# Patient Record
Sex: Male | Born: 1995 | Race: Black or African American | Hispanic: No | Marital: Single | Smoking: Current every day smoker
Health system: Southern US, Community
[De-identification: ages and names within clinical notes are randomized; demographics above are authoritative.]

---
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
## 2014-05-14 ENCOUNTER — Emergency Department (HOSPITAL_COMMUNITY)
Admission: EM | Admit: 2014-05-14 | Discharge: 2014-05-14 | Disposition: A | Discharge status: Home / Self Care | Payer: Managed Care, Other (non HMO) | Source: Home / Self Care | Attending: Emergency Medicine | Admitting: Emergency Medicine

## 2014-05-14 ENCOUNTER — Encounter (HOSPITAL_COMMUNITY): Payer: Self-pay | Admitting: Emergency Medicine

## 2014-05-14 ENCOUNTER — Emergency Department (INDEPENDENT_AMBULATORY_CARE_PROVIDER_SITE_OTHER): Payer: Managed Care, Other (non HMO)

## 2014-05-14 DIAGNOSIS — J069 Acute upper respiratory infection, unspecified: Secondary | ICD-10-CM

## 2014-05-14 MED ORDER — GUAIFENESIN-CODEINE 100-10 MG/5ML PO SYRP: 5.0000 mL | ORAL_SOLUTION | Freq: Every evening | ORAL | Status: DC | PRN

## 2014-05-14 MED ORDER — ALBUTEROL SULFATE HFA 108 (90 BASE) MCG/ACT IN AERS: 2.0000 | INHALATION_SPRAY | Freq: Four times a day (QID) | RESPIRATORY_TRACT | Status: DC | PRN

## 2014-05-14 NOTE — ED Notes (Signed)
eval by MD only

## 2014-05-14 NOTE — ED Provider Notes (Signed)
CSN: [id number]     Arrival date & time 05/14/14  1819 History   First MD Initiated Contact with Patient 05/14/14 1855     No chief complaint on file.  (Consider location/radiation/quality/duration/timing/severity/associated sxs/prior Treatment) HPI Patient is a 18 yo M presenting to Urgent Care for 1 day history of weakness, nausea, cough and mucus. No fevers at home but does report chills. Tried Mucinex at home which did not help. No known sick contacts. Works with [employer]. No vomiting, some loose stools. Smokes non-tobacco cigarettes daily. Main concern is that he feels shortness of breath when lying back flat associated with cough.   No past medical history on file. No past surgical history on file. Family History  Problem Relation Age of Onset  . Family history unknown: Yes   History  Substance Use Topics  . Smoking status: Not on file  . Smokeless tobacco: Not on file  . Alcohol Use:     Review of Systems  Constitutional: Positive for chills, appetite change and fatigue. Negative for fever.  HENT: Positive for congestion, sinus pressure and sore throat.   Eyes: Negative for visual disturbance.  Respiratory: Positive for cough and shortness of breath. Negative for wheezing.   Cardiovascular: Negative for chest pain and leg swelling.  Gastrointestinal: Positive for nausea and diarrhea. Negative for abdominal pain.  Genitourinary: Negative for dysuria.  Musculoskeletal: Negative for arthralgias and myalgias.  Skin: Negative for rash.  Neurological: Negative for headaches.    Allergies  Review of patient's allergies indicates no known allergies.  Home Medications   Prior to Admission medications   Medication Sig Start Date End Date Taking? Authorizing Provider  acetaminophen-codeine (TYLENOL #3) 300-30 MG per tablet Take 1 tablet by mouth every 4 (four) hours as needed for pain. 11/11/12   Adlih Moreno-Coll, MD  loperamide (IMODIUM) 2 MG capsule Take 1 capsule (2 mg  total) by mouth 4 (four) times daily as needed for diarrhea or loose stools. 11/11/12   Adlih Moreno-Coll, MD  ondansetron (ZOFRAN-ODT) 4 MG disintegrating tablet Take 1 tablet (4 mg total) by mouth every 8 (eight) hours as needed for nausea. 11/11/12   Adlih Moreno-Coll, MD   BP 140/79  Pulse 86  Temp(Src) 98.6 F (37 C) (Oral)  Resp 14  SpO2 97% Physical Exam  Constitutional: He is oriented to person, place, and time. He appears well-developed and well-nourished. No distress.  HENT:  Head: Normocephalic and atraumatic.  Posterior pharynx erythema without exudate  Eyes: Conjunctivae are normal. Pupils are equal, round, and reactive to light.  Neck: Normal range of motion. Neck supple.  Cardiovascular: Normal rate, regular rhythm and normal heart sounds.   No murmur heard. Pulmonary/Chest: Effort normal. No respiratory distress. He has wheezes (Left sided).  Abdominal: Soft. He exhibits no distension. There is no tenderness.  Musculoskeletal: Normal range of motion. He exhibits no edema and no tenderness.  Neurological: He is alert and oriented to person, place, and time.  Skin: Skin is warm and dry.  Psychiatric: He has a normal mood and affect.    ED Course  Procedures (including critical care time) Labs Review Labs Reviewed - No data to display  Imaging Review No results found.   MDM  No diagnosis found. 7:25pm- Patient evaluated. Noted to have focal wheezes on left side. Will obtain X-ray. Discussed with patient and he agrees to proceed.  7:55pm- CXR does not reveal pneumonia or other active pulmonary disease. Wheezes most likely secondary to smoking and/or viral infection.  Will proceed with treatment for viral URI. Robitussin AC prn cough, advised it will make him sleepy. Albuterol prn wheezing or cough. Discussed no indication for antibiotic. F/u if he does not improve.   Hilarie FredricksonAmber M Lateefah Mallery, MD 05/14/14 2004

## 2014-05-14 NOTE — ED Provider Notes (Signed)
Medical screening examination/treatment/procedure(s) were performed by a resident physician and as supervising physician I was immediately available for consultation/collaboration.  Leslee Home, M.D.  Reuben Likes, MD 05/14/14 2224

## 2014-05-14 NOTE — Discharge Instructions (Signed)
Cough, Adult ° A cough is a reflex. It helps you clear your throat and airways. A cough can help heal your body. A cough can last 2 or 3 weeks (acute) or may last more than 8 weeks (chronic). Some common causes of a cough can include an infection, allergy, or a cold. °HOME CARE °· Only take medicine as told by your doctor. °· If given, take your medicines (antibiotics) as told. Finish them even if you start to feel better. °· Use a cold steam vaporizer or humidier in your home. This can help loosen thick spit (secretions). °· Sleep so you are almost sitting up (semi-upright). Use pillows to do this. This helps reduce coughing. °· Rest as needed. °· Stop smoking if you smoke. °GET HELP RIGHT AWAY IF: °· You have yellowish-white fluid (pus) in your thick spit. °· Your cough gets worse. °· Your medicine does not reduce coughing, and you are losing sleep. °· You cough up blood. °· You have trouble breathing. °· Your pain gets worse and medicine does not help. °· You have a fever. °MAKE SURE YOU:  °· Understand these instructions. °· Will watch your condition. °· Will get help right away if you are not doing well or get worse. °Document Released: 08/05/2011 Document Revised: 02/14/2012 Document Reviewed: 08/05/2011 °Exit[employer]® Patient Information ©2014 [employer]. °Upper Respiratory Infection, Adult °An upper respiratory infection (URI) is also known as the common cold. It is often caused by a type of germ (virus). Colds are easily spread (contagious). You can pass it to others by kissing, coughing, sneezing, or drinking out of the same glass. Usually, you get better in 1 or 2 weeks.  °HOME CARE  °· Only take medicine as told by your doctor. °· Use a warm mist humidifier or breathe in steam from a hot shower. °· Drink enough water and fluids to keep your pee (urine) clear or pale yellow. °· Get plenty of rest. °· Return to work when your temperature is back to normal or as told by your doctor. You may use a face mask  and wash your hands to stop your cold from spreading. °GET HELP RIGHT AWAY IF:  °· After the first few days, you feel you are getting worse. °· You have questions about your medicine. °· You have chills, shortness of breath, or brown or red spit (mucus). °· You have yellow or brown snot (nasal discharge) or pain in the face, especially when you bend forward. °· You have a fever, puffy (swollen) neck, pain when you swallow, or white spots in the back of your throat. °· You have a bad headache, ear pain, sinus pain, or chest pain. °· You have a high-pitched whistling sound when you breathe in and out (wheezing). °· You have a lasting cough or cough up blood. °· You have sore muscles or a stiff neck. °MAKE SURE YOU:  °· Understand these instructions. °· Will watch your condition. °· Will get help right away if you are not doing well or get worse. °Document Released: 05/10/2008 Document Revised: 02/14/2012 Document Reviewed: 03/29/2011 °Exit[employer]® Patient Information ©2014 [employer]. ° °

## 2014-09-07 ENCOUNTER — Emergency Department (HOSPITAL_COMMUNITY)
Admission: EM | Admit: 2014-09-07 | Discharge: 2014-09-07 | Disposition: A | Discharge status: Home / Self Care | Payer: No Typology Code available for payment source | Attending: Emergency Medicine | Admitting: Emergency Medicine

## 2014-09-07 ENCOUNTER — Emergency Department (HOSPITAL_COMMUNITY): Payer: No Typology Code available for payment source

## 2014-09-07 ENCOUNTER — Encounter (HOSPITAL_COMMUNITY): Payer: Self-pay | Admitting: Emergency Medicine

## 2014-09-07 DIAGNOSIS — S4980XA Other specified injuries of shoulder and upper arm, unspecified arm, initial encounter: Secondary | ICD-10-CM | POA: Diagnosis not present

## 2014-09-07 DIAGNOSIS — Z720 Tobacco use: Secondary | ICD-10-CM | POA: Diagnosis not present

## 2014-09-07 DIAGNOSIS — G44309 Post-traumatic headache, unspecified, not intractable: Secondary | ICD-10-CM

## 2014-09-07 DIAGNOSIS — M25562 Pain in left knee: Secondary | ICD-10-CM

## 2014-09-07 DIAGNOSIS — Y9389 Activity, other specified: Secondary | ICD-10-CM | POA: Diagnosis not present

## 2014-09-07 DIAGNOSIS — S098XXA Other specified injuries of head, initial encounter: Secondary | ICD-10-CM | POA: Diagnosis not present

## 2014-09-07 DIAGNOSIS — S8980XA Other specified injuries of unspecified lower leg, initial encounter: Secondary | ICD-10-CM | POA: Diagnosis not present

## 2014-09-07 DIAGNOSIS — Y92410 Unspecified street and highway as the place of occurrence of the external cause: Secondary | ICD-10-CM | POA: Insufficient documentation

## 2014-09-07 DIAGNOSIS — M25512 Pain in left shoulder: Secondary | ICD-10-CM

## 2014-09-07 MED ORDER — METHOCARBAMOL 500 MG PO TABS: 500.0000 mg | ORAL_TABLET | Freq: Two times a day (BID) | ORAL | Status: AC

## 2014-09-07 MED ORDER — IBUPROFEN 800 MG PO TABS: 800.0000 mg | ORAL_TABLET | Freq: Three times a day (TID) | ORAL | Status: AC

## 2014-09-07 MED ORDER — HYDROCODONE-ACETAMINOPHEN 5-325 MG PO TABS
2.0000 | ORAL_TABLET | Freq: Once | ORAL | Status: AC
  Administered 2014-09-07: 2 via ORAL
  Filled 2014-09-07: qty 2
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
## 2014-09-07 NOTE — ED Notes (Signed)
Pt ambulated in hall with no complaints of pain. Pt maintained steady gait.

## 2014-09-07 NOTE — Discharge Instructions (Signed)
1. Medications: robaxin, ibuprofen, usual home medications 2. Treatment: rest, drink plenty of fluids, gentle stretching as discussed, alternate ice and heat 3. Follow Up: Please followup with your primary doctor for discussion of your diagnoses and further evaluation after today's visit; if you do not have a primary care doctor use the resource guide provided to find one;   Back Exercises Back exercises help treat and prevent back injuries. The goal of back exercises is to increase the strength of your abdominal and back muscles and the flexibility of your back. These exercises should be started when you no longer have back pain. Back exercises include:  Pelvic Tilt. Lie on your back with your knees bent. Tilt your pelvis until the lower part of your back is against the floor. Hold this position 5 to 10 sec and repeat 5 to 10 times.  Knee to Chest. Pull first 1 knee up against your chest and hold for 20 to 30 seconds, repeat this with the other knee, and then both knees. This may be done with the other leg straight or bent, whichever feels better.  Sit-Ups or Curl-Ups. Bend your knees 90 degrees. Start with tilting your pelvis, and do a partial, slow sit-up, lifting your trunk only 30 to 45 degrees off the floor. Take at least 2 to 3 seconds for each sit-up. Do not do sit-ups with your knees out straight. If partial sit-ups are difficult, simply do the above but with only tightening your abdominal muscles and holding it as directed.  Hip-Lift. Lie on your back with your knees flexed 90 degrees. Push down with your feet and shoulders as you raise your hips a couple inches off the floor; hold for 10 seconds, repeat 5 to 10 times.  Back arches. Lie on your stomach, propping yourself up on bent elbows. Slowly press on your hands, causing an arch in your low back. Repeat 3 to 5 times. Any initial stiffness and discomfort should lessen with repetition over time.  Shoulder-Lifts. Lie face down with arms  beside your body. Keep hips and torso pressed to floor as you slowly lift your head and shoulders off the floor. Do not overdo your exercises, especially in the beginning. Exercises may cause you some mild back discomfort which lasts for a few minutes; however, if the pain is more severe, or lasts for more than 15 minutes, do not continue exercises until you see your caregiver. Improvement with exercise therapy for back problems is slow.  See your caregivers for assistance with developing a proper back exercise program. Document Released: 12/30/2004 Document Revised: 02/14/2012 Document Reviewed: 09/23/2011 [facility] Patient Information 2015 [employer], [employer]. This information is not intended to replace advice given to you by your health care provider. Make sure you discuss any questions you have with your health care provider.

## 2014-09-08 NOTE — ED Provider Notes (Signed)
Medical screening examination/treatment/procedure(s) were performed by non-physician practitioner and as supervising physician I was immediately available for consultation/collaboration.   EKG Interpretation None       Vanetta MuldersScott Kassadi Presswood, MD 09/08/14 1359

## 2017-04-11 ENCOUNTER — Emergency Department (HOSPITAL_COMMUNITY)
Admission: EM | Admit: 2017-04-11 | Discharge: 2017-04-11 | Disposition: A | Discharge status: Home / Self Care | Payer: Managed Care, Other (non HMO) | Attending: Emergency Medicine | Admitting: Emergency Medicine

## 2017-04-11 ENCOUNTER — Encounter (HOSPITAL_COMMUNITY): Payer: Self-pay | Admitting: Emergency Medicine

## 2017-04-11 DIAGNOSIS — B349 Viral infection, unspecified: Secondary | ICD-10-CM | POA: Diagnosis not present

## 2017-04-11 DIAGNOSIS — F17210 Nicotine dependence, cigarettes, uncomplicated: Secondary | ICD-10-CM | POA: Insufficient documentation

## 2017-04-11 DIAGNOSIS — R6889 Other general symptoms and signs: Secondary | ICD-10-CM

## 2017-04-11 DIAGNOSIS — J3489 Other specified disorders of nose and nasal sinuses: Secondary | ICD-10-CM | POA: Diagnosis present

## 2017-04-11 MED ORDER — IBUPROFEN 400 MG PO TABS
400.0000 mg | ORAL_TABLET | Freq: Once | ORAL | Status: AC
  Administered 2017-04-11: 400 mg via ORAL
  Filled 2017-04-11: qty 1

## 2017-04-11 NOTE — ED Triage Notes (Signed)
Pt to ER for evaluation of nasal congestion and body aches x3 days.

## 2017-04-11 NOTE — Discharge Instructions (Signed)
It was our pleasure to provide your ER care today - we hope that you feel better. ° °Your symptoms appear most consistent with a viral illness which should run its course and get better in the next week.  ° °Rest. Drink plenty of fluids. ° °Take acetaminophen and ibuprofen as need for fever and/or body aches.  ° °Follow up with primary care doctor in 1 week if symptoms fail to improve/resolve. ° °Return to ER if worse, trouble breathing, other concern.  °

## 2017-04-11 NOTE — ED Notes (Signed)
Patient given thermometer, and instructed on how to use it.

## 2017-04-11 NOTE — ED Provider Notes (Signed)
[facility]Y DEPT Provider Note   CSN: [id number] Arrival date & time: 04/11/17  1047   By signing my name below, I, Soijett Blue, attest that this documentation has been prepared under the direction and in the presence of Cathren Laine, MD. Electronically Signed: Soijett Blue, ED Scribe. 04/11/17. 12:15 PM.  History   Chief Complaint Chief Complaint  Patient presents with  . URI    HPI Isaac Decker is a 21 y.o. male who presents to the Emergency Department complaining of URI-like symptoms onset 3 days ago. Pt reports associated chills, night sweats, rhinorrhea, sneezing, generalized weakness, HA, and mild diarrhea. Pt has not tried any medications for the relief of his symptoms. Denies sick contacts with similar symptoms. He denies cough, sore throat, body aches, vomiting, CP, rash, swelling, blood in stool, hematuria, and any other symptoms. Denies PMHx of asthma or DM.    The history is provided by the patient. No language interpreter was used.    History reviewed. No pertinent past medical history.  There are no active problems to display for this patient.   History reviewed. No pertinent surgical history.     Home Medications    Prior to Admission medications   Medication Sig Start Date End Date Taking? Authorizing Provider  ibuprofen (ADVIL,MOTRIN) 800 MG tablet Take 1 tablet (800 mg total) by mouth 3 (three) times daily. 09/07/14   Muthersbaugh, Dahlia Client, PA-C  methocarbamol (ROBAXIN) 500 MG tablet Take 1 tablet (500 mg total) by mouth 2 (two) times daily. 09/07/14   Muthersbaugh, Dahlia Client, PA-C    Family History History reviewed. No pertinent family history.  Social History Social History  Substance Use Topics  . Smoking status: Current Every Day Smoker    Packs/day: 0.50    Types: Cigarettes  . Smokeless tobacco: Never Used  . Alcohol use Yes     Comment: occasional     Allergies   Patient has no known allergies.   Review of Systems Review of  Systems  Constitutional: Positive for chills.  HENT: Positive for congestion, rhinorrhea and sneezing. Negative for sore throat.   Respiratory: Negative for cough.   Cardiovascular: Negative for chest pain.  Gastrointestinal: Positive for diarrhea. Negative for blood in stool and vomiting.  Genitourinary: Negative for hematuria.  Musculoskeletal: Negative for joint swelling and myalgias.  Skin: Negative for rash.  Neurological: Positive for weakness (generalized) and headaches.     Physical Exam Updated Vital Signs BP (!) 145/65 (BP Location: Left Arm)   Pulse 66   Temp 99 F (37.2 C) (Oral)   Resp 18   Ht 5\' 11"  (1.803 m)   Wt 165 lb (74.8 kg)   SpO2 99%   BMI 23.01 kg/m   Physical Exam  Constitutional: He appears well-developed and well-nourished. No distress.  HENT:  Head: Normocephalic and atraumatic.  Right Ear: Tympanic membrane, external ear and ear canal normal.  Left Ear: Tympanic membrane, external ear and ear canal normal.  Mouth/Throat: Oropharynx is clear and moist.  Mild nasal congestion  Eyes: Conjunctivae are normal.  Neck: Neck supple. No tracheal deviation present.  No stiffness  Cardiovascular: Normal rate, regular rhythm, normal heart sounds and intact distal pulses.  Exam reveals no gallop and no friction rub.   No murmur heard. Pulmonary/Chest: Effort normal and breath sounds normal. No respiratory distress. He has no wheezes. He has no rales.  Abdominal: Soft. He exhibits no distension. There is no tenderness.  Genitourinary:  Genitourinary Comments: No cva tenderness  Musculoskeletal:  Normal range of motion. He exhibits no edema.  Lymphadenopathy:    He has no cervical adenopathy.  Neurological: He is alert.  Speech normal. Steady gait.   Skin: Skin is warm and dry. No rash noted.  Psychiatric: He has a normal mood and affect. His behavior is normal.  Nursing note and vitals reviewed.    ED Treatments / Results  DIAGNOSTIC  STUDIES: Oxygen Saturation is 99% on RA, nl by my interpretation.    COORDINATION OF CARE: 12:15 PM Discussed treatment plan with pt at bedside and pt agreed to plan.   Labs (all labs ordered are listed, but only abnormal results are displayed) Labs Reviewed - No data to display  EKG  EKG Interpretation None       Radiology No results found.  Procedures Procedures (including critical care time)  Medications Ordered in ED Medications - No data to display   Initial Impression / Assessment and Plan / ED Course  I have reviewed the triage vital signs and the nursing notes.  Pertinent labs & imaging results that were available during my care of the patient were reviewed by me and considered in my medical decision making (see chart for details).  Symptoms and exam most c/w viral illness.  Patient appears stable for d/c.   Acetaminophen po.    Final Clinical Impressions(s) / ED Diagnoses   Final diagnoses:  None    New Prescriptions New Prescriptions   No medications on file   I personally performed the services described in this documentation, which was scribed in my presence. The recorded information has been reviewed and considered. Cathren LaineSteinl, Ashyla Luth, MD    Cathren LaineSteinl, Nevyn Bossman, MD 04/11/17 [phone]

## 2017-04-12 ENCOUNTER — Encounter (HOSPITAL_COMMUNITY): Payer: Self-pay | Admitting: Emergency Medicine

## 2017-04-12 ENCOUNTER — Emergency Department (HOSPITAL_COMMUNITY)
Admission: EM | Admit: 2017-04-12 | Discharge: 2017-04-12 | Disposition: A | Discharge status: Home / Self Care | Payer: Managed Care, Other (non HMO) | Attending: Emergency Medicine | Admitting: Emergency Medicine

## 2017-04-12 ENCOUNTER — Emergency Department (HOSPITAL_COMMUNITY): Payer: Managed Care, Other (non HMO)

## 2017-04-12 DIAGNOSIS — Z79899 Other long term (current) drug therapy: Secondary | ICD-10-CM | POA: Diagnosis not present

## 2017-04-12 DIAGNOSIS — R042 Hemoptysis: Secondary | ICD-10-CM | POA: Diagnosis not present

## 2017-04-12 DIAGNOSIS — F17210 Nicotine dependence, cigarettes, uncomplicated: Secondary | ICD-10-CM | POA: Diagnosis not present

## 2017-04-12 LAB — CBC WITH DIFFERENTIAL/PLATELET
Basophils Absolute: 0.1 10*3/uL (ref 0.0–0.1)
Basophils Relative: 1 %
Eosinophils Absolute: 0.2 10*3/uL (ref 0.0–0.7)
Eosinophils Relative: 3 %
HCT: 41.1 % (ref 39.0–52.0)
Hemoglobin: 13 g/dL (ref 13.0–17.0)
Lymphocytes Relative: 24 %
Lymphs Abs: 2 10*3/uL (ref 0.7–4.0)
MCH: 28.5 pg (ref 26.0–34.0)
MCHC: 31.6 g/dL (ref 30.0–36.0)
MCV: 90.1 fL (ref 78.0–100.0)
Monocytes Absolute: 0.5 10*3/uL (ref 0.1–1.0)
Monocytes Relative: 6 %
Neutro Abs: 5.6 10*3/uL (ref 1.7–7.7)
Neutrophils Relative %: 66 %
Platelets: 229 10*3/uL (ref 150–400)
RBC: 4.56 MIL/uL (ref 4.22–5.81)
RDW: 12.6 % (ref 11.5–15.5)
WBC: 8.4 10*3/uL (ref 4.0–10.5)

## 2017-04-12 LAB — I-STAT CG4 LACTIC ACID, ED: Lactic Acid, Venous: 1.88 mmol/L (ref 0.5–1.9)

## 2017-04-12 LAB — COMPREHENSIVE METABOLIC PANEL
ALT: 23 U/L (ref 17–63)
AST: 24 U/L (ref 15–41)
Albumin: 3.9 g/dL (ref 3.5–5.0)
Alkaline Phosphatase: 66 U/L (ref 38–126)
Anion gap: 8 (ref 5–15)
BUN: 7 mg/dL (ref 6–20)
CO2: 27 mmol/L (ref 22–32)
Calcium: 9.3 mg/dL (ref 8.9–10.3)
Chloride: 105 mmol/L (ref 101–111)
Creatinine, Ser: 1.12 mg/dL (ref 0.61–1.24)
GFR calc Af Amer: >60 mL/min (ref >60)
GFR calc non Af Amer: >60 mL/min (ref >60)
Glucose, Bld: 92 mg/dL (ref 65–99)
Potassium: 3.5 mmol/L (ref 3.5–5.1)
Sodium: 140 mmol/L (ref 135–145)
Total Bilirubin: 0.7 mg/dL (ref 0.3–1.2)
Total Protein: 7.3 g/dL (ref 6.5–8.1)

## 2017-04-12 NOTE — ED Triage Notes (Signed)
Pt seen here last night for flu like symptoms-- started coughing up blood today-- pt has pictures on phone-- blood tinged sputum. Also has had night sweats--for 3 days.

## 2017-04-12 NOTE — ED Notes (Signed)
Pt ambulatory to check-in and no acute distress noted.

## 2017-04-12 NOTE — ED Provider Notes (Signed)
[facility] Provider Note   CSN: [id number] Arrival date & time: 04/12/17  1431  By signing my name below, I, Sonum Patel, attest that this documentation has been prepared under the direction and in the presence of Raeford Razor, MD. Electronically Signed: Sonum Patel, Neurosurgeon. 04/12/17. 5:15 PM.  History   Chief Complaint Chief Complaint  Patient presents with  . Hemoptysis  . flu like symptoms    The history is provided by the patient. No language interpreter was used.     HPI Comments: Isaac Decker is a 21 y.o. male who presents to the Emergency Department complaining of generalized fatigue with associated chills, HA, SOB, and mild cough for the past few days. He notes having a small amount of blood-streaked sputum this morning. He also had night sweats for the past 2-3 days. He denies CP, sore throat, dizziness, lightheadedness, unexpected weight change. He is a daily smoker.    History reviewed. No pertinent past medical history.  There are no active problems to display for this patient.   History reviewed. No pertinent surgical history.     Home Medications    Prior to Admission medications   Medication Sig Start Date End Date Taking? Authorizing Provider  ibuprofen (ADVIL,MOTRIN) 800 MG tablet Take 1 tablet (800 mg total) by mouth 3 (three) times daily. 09/07/14   Muthersbaugh, Dahlia Client, PA-C  methocarbamol (ROBAXIN) 500 MG tablet Take 1 tablet (500 mg total) by mouth 2 (two) times daily. 09/07/14   Muthersbaugh, Dahlia Client, PA-C    Family History No family history on file.  Social History Social History  Substance Use Topics  . Smoking status: Current Every Day Smoker    Packs/day: 0.50    Types: Cigarettes  . Smokeless tobacco: Never Used  . Alcohol use Yes     Comment: occasional     Allergies   Patient has no known allergies.   Review of Systems Review of Systems  All other systems reviewed and are negative for acute change except as noted in  the HPI.   Physical Exam Updated Vital Signs BP (!) 141/88   Pulse 70   Temp (S) 99.6 F (37.6 C) (Oral)   Resp 18   Ht 5\' 11"  (1.803 m)   Wt 165 lb (74.8 kg)   SpO2 100%   BMI 23.01 kg/m   Physical Exam  Constitutional: He is oriented to person, place, and time. He appears well-developed and well-nourished.  HENT:  Head: Normocephalic and atraumatic.  Eyes: EOM are normal.  Neck: Normal range of motion.  Cardiovascular: Normal rate, regular rhythm, normal heart sounds and intact distal pulses.   Pulmonary/Chest: Effort normal and breath sounds normal. No respiratory distress. He has no wheezes. He has no rales.  Abdominal: Soft. He exhibits no distension. There is no tenderness.  Musculoskeletal: Normal range of motion.  Neurological: He is alert and oriented to person, place, and time.  Skin: Skin is warm and dry.  Psychiatric: He has a normal mood and affect. Judgment normal.  Nursing note and vitals reviewed.    ED Treatments / Results  DIAGNOSTIC STUDIES: Oxygen Saturation is 100% on RA, normal by my interpretation.    COORDINATION OF CARE: 5:14 PM Discussed treatment plan with pt at bedside and pt agreed to plan.   Labs (all labs ordered are listed, but only abnormal results are displayed) Labs Reviewed  COMPREHENSIVE METABOLIC PANEL  CBC WITH DIFFERENTIAL/PLATELET  I-STAT CG4 LACTIC ACID, ED    EKG  EKG  Interpretation None       Radiology Dg Chest 2 View  Result Date: 04/12/2017 CLINICAL DATA:  Hemoptysis EXAM: CHEST  2 VIEW COMPARISON:  05/14/2014 FINDINGS: Lungs are clear.  No pleural effusion or pneumothorax. The heart is normal in size. Visualized osseous structures are within normal limits. IMPRESSION: Normal chest radiographs. Electronically Signed   By: Charline BillsSriyesh  Krishnan M.D.   On: 04/12/2017 15:52    Procedures Procedures (including critical care time)  Medications Ordered in ED Medications - No data to display   Initial Impression /  Assessment and Plan / ED Course  I have reviewed the triage vital signs and the nursing notes.  Pertinent labs & imaging results that were available during my care of the patient were reviewed by me and considered in my medical decision making (see chart for details).     20yM with scant hemoptysis. CXR clear. I doubt PE. HR in 60-70s. Denies dyspnea. o2 sats normal on RA. No signs/symptoms of DVT. No obvious risk factors. Mild airway irritation? Doubt serious etiology.   Final Clinical Impressions(s) / ED Diagnoses   Final diagnoses:  Hemoptysis    New Prescriptions New Prescriptions   No medications on file   I personally preformed the services scribed in my presence. The recorded information has been reviewed is accurate. Raeford RazorStephen Kaiesha Tonner, MD.    Raeford RazorKohut, Roneshia Drew, MD 04/18/17 [phone]

## 2017-04-12 NOTE — ED Notes (Signed)
Pt state he understands instructions home stable wioth steady gait.

## 2018-08-17 IMAGING — DX DG CHEST 2V
2 series · 2 of 2 positions shown · non-contrast
Comparison: 05/14/2014

CLINICAL DATA: Hemoptysis

EXAM:
CHEST  2 VIEW

[chest pa]
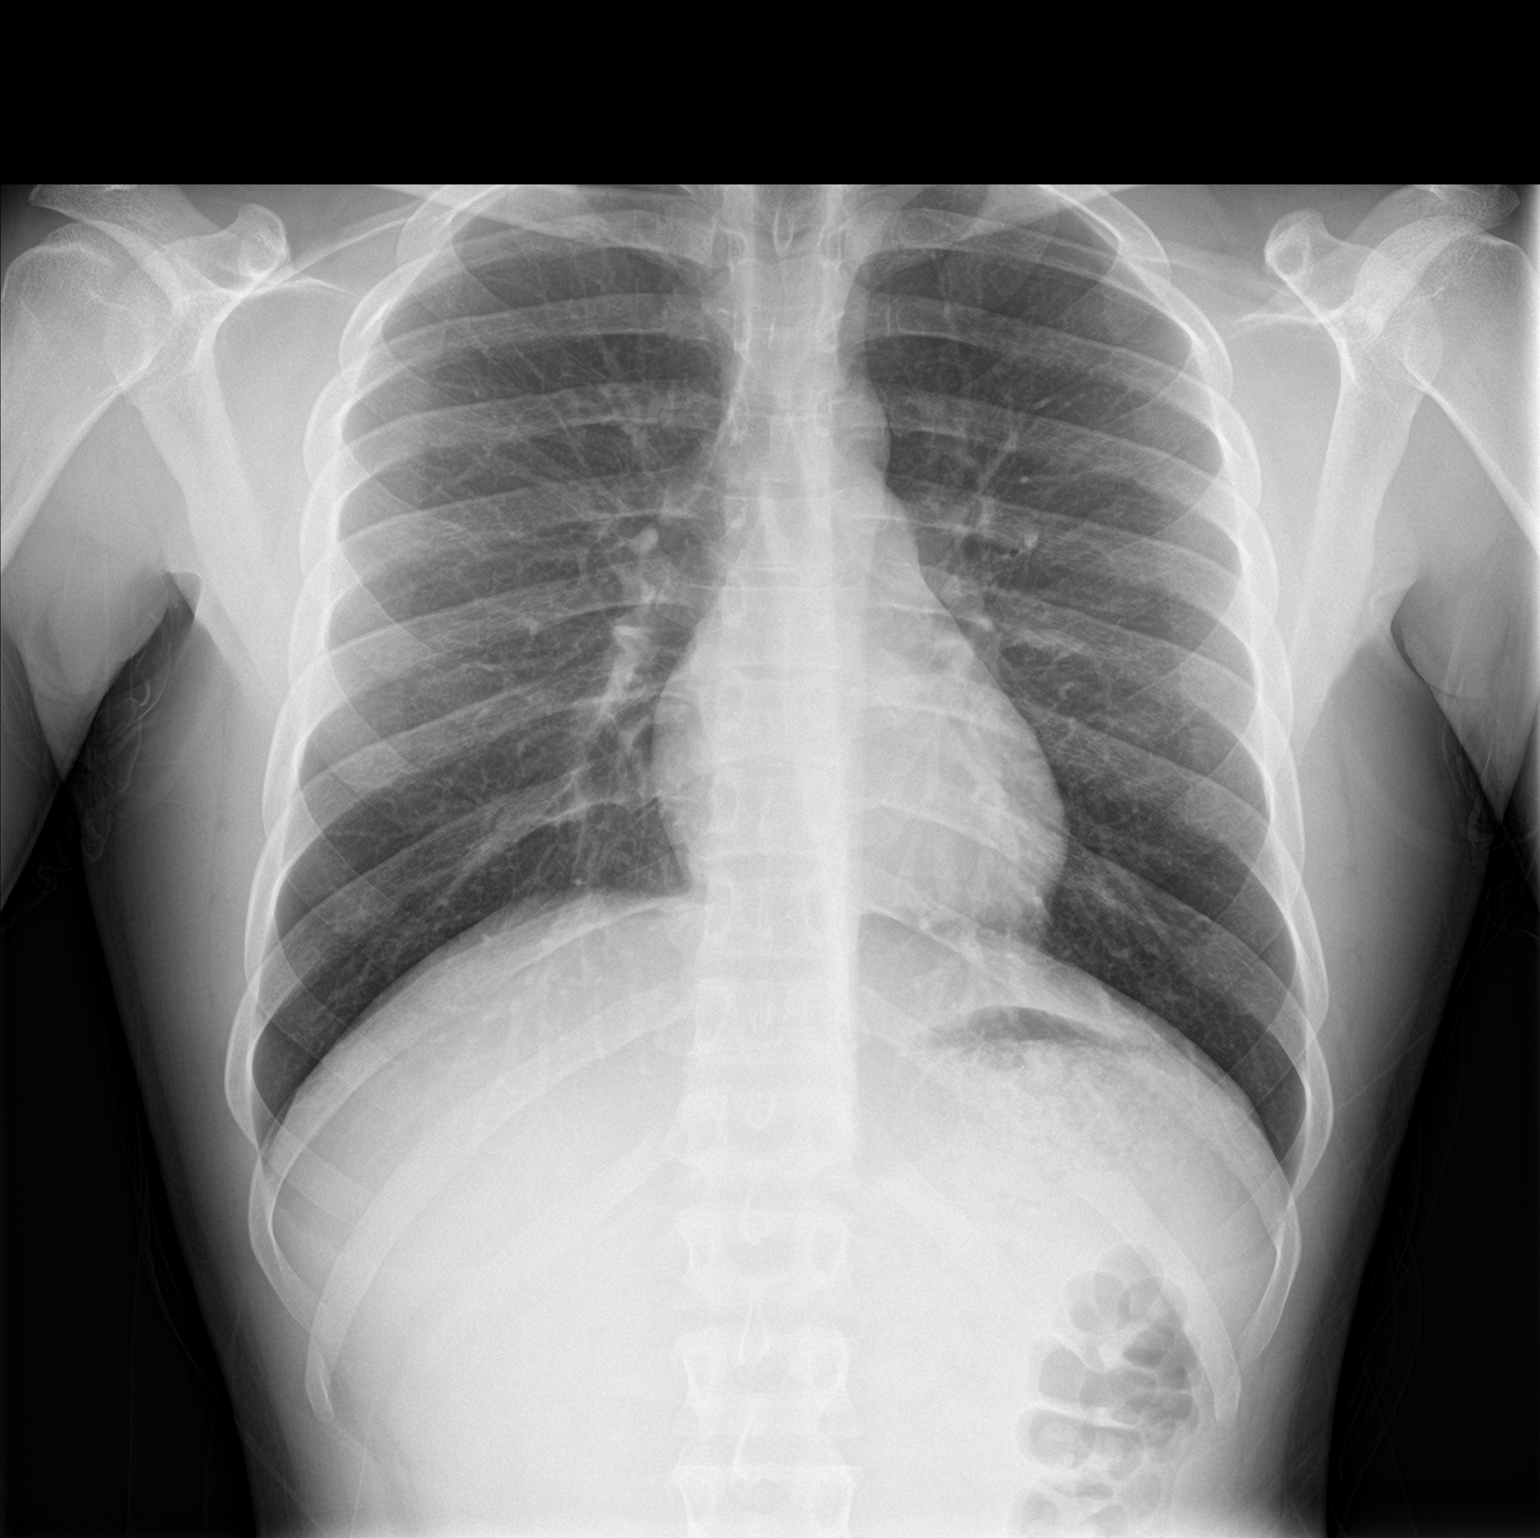

[chest lat]
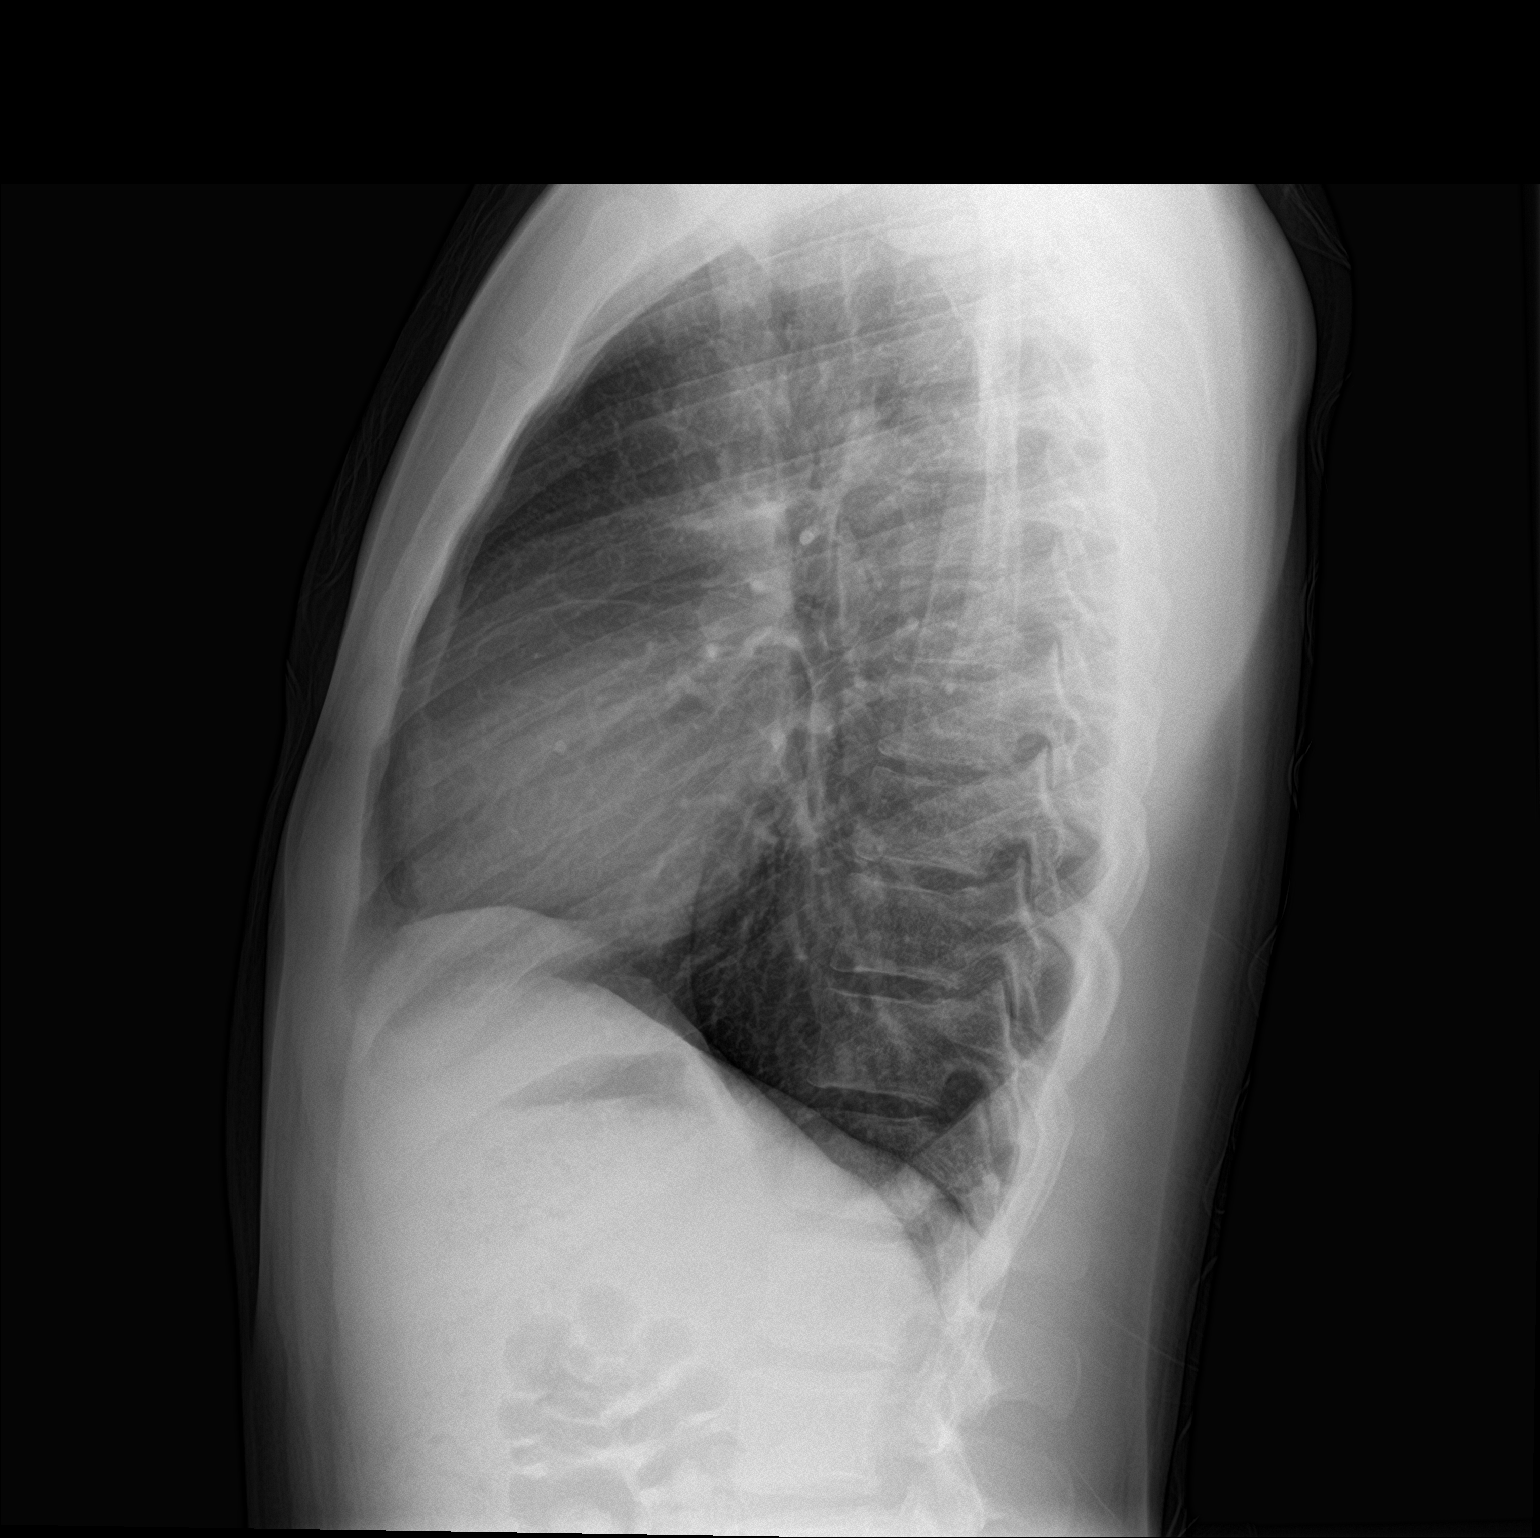

[2 of 2 positions shown; findings below may reference images not displayed]

FINDINGS: Lungs are clear.  No pleural effusion or pneumothorax.

The heart is normal in size.

Visualized osseous structures are within normal limits.
IMPRESSION: Normal chest radiographs.

## 2020-02-13 ENCOUNTER — Other Ambulatory Visit: Payer: Self-pay

## 2020-02-13 ENCOUNTER — Ambulatory Visit (HOSPITAL_COMMUNITY)
Admission: EM | Admit: 2020-02-13 | Discharge: 2020-02-13 | Disposition: A | Discharge status: Home / Self Care | Payer: Managed Care, Other (non HMO) | Attending: Emergency Medicine | Admitting: Emergency Medicine

## 2020-02-13 ENCOUNTER — Encounter (HOSPITAL_COMMUNITY): Payer: Self-pay

## 2020-02-13 DIAGNOSIS — R05 Cough: Secondary | ICD-10-CM | POA: Diagnosis present

## 2020-02-13 DIAGNOSIS — R059 Cough, unspecified: Secondary | ICD-10-CM

## 2020-02-13 DIAGNOSIS — R509 Fever, unspecified: Secondary | ICD-10-CM | POA: Diagnosis present

## 2020-02-13 DIAGNOSIS — Z20822 Contact with and (suspected) exposure to covid-19: Secondary | ICD-10-CM | POA: Diagnosis not present

## 2020-02-13 NOTE — ED Triage Notes (Signed)
Pt states he has a cough this started yesterday. Pt needs a work note. Pt states he dose not want a Covid test.

## 2020-02-13 NOTE — ED Provider Notes (Signed)
[place]    CSN: [id number] Arrival date & time: 02/13/20  1808      History   Chief Complaint Chief Complaint  Patient presents with  . Cough    HPI Isaac Decker is a 24 y.o. male.   Pt here for cough, fever, and not feeling well. States that he works at [employer]. Sx began yesterday. Everyone in his house has had vaccine. Denies any sx at home. Has not taken anything pta.      History reviewed. No pertinent past medical history.  There are no problems to display for this patient.   History reviewed. No pertinent surgical history.     Home Medications    Prior to Admission medications   Medication Sig Start Date End Date Taking? Authorizing Provider  ibuprofen (ADVIL,MOTRIN) 800 MG tablet Take 1 tablet (800 mg total) by mouth 3 (three) times daily. 09/07/14   Muthersbaugh, Jarrett Soho, PA-C  methocarbamol (ROBAXIN) 500 MG tablet Take 1 tablet (500 mg total) by mouth 2 (two) times daily. 09/07/14   Muthersbaugh, Jarrett Soho, PA-C    Family History History reviewed. No pertinent family history.  Social History Social History   Tobacco Use  . Smoking status: Current Every Day Smoker    Packs/day: 0.50    Types: Cigarettes  . Smokeless tobacco: Never Used  Substance Use Topics  . Alcohol use: Yes    Comment: occasional  . Drug use: Yes    Frequency: 4.0 times per week    Types: Marijuana     Allergies   Patient has no known allergies.   Review of Systems Review of Systems  Constitutional: Positive for chills and fever.  Respiratory: Positive for cough.   Cardiovascular: Negative.   Gastrointestinal: Negative.   Neurological: Negative.      Physical Exam Triage Vital Signs ED Triage Vitals  Enc Vitals Group     BP 02/13/20 1909 122/70     Pulse Rate 02/13/20 1909 64     Resp 02/13/20 1909 16     Temp 02/13/20 1909 99.1 F (37.3 C)     Temp Source 02/13/20 1909 Oral     SpO2 02/13/20 1909 100 %     Weight 02/13/20 1906  150 lb (68 kg)     Height --      Head Circumference --      Peak Flow --      Pain Score 02/13/20 1906 0     Pain Loc --      Pain Edu? --      Excl. in [place]? --    No data found.  Updated Vital Signs BP 122/70 (BP Location: Right Arm)   Pulse 64   Temp 99.1 F (37.3 C) (Oral)   Resp 16   Wt 150 lb (68 kg)   SpO2 100%   BMI 20.92 kg/m   Visual Acuity     Physical Exam Vitals and nursing note reviewed.  HENT:     Right Ear: Tympanic membrane normal.     Left Ear: Tympanic membrane normal.     Nose: Nose normal.     Mouth/Throat:     Mouth: Mucous membranes are moist.  Eyes:     Pupils: Pupils are equal, round, and reactive to light.  Cardiovascular:     Rate and Rhythm: Normal rate.     Pulses: Normal pulses.  Pulmonary:     Effort: Pulmonary effort is normal.  Abdominal:  General: Abdomen is flat.  Musculoskeletal:     Cervical back: Normal range of motion.  Skin:    General: Skin is warm.  Neurological:     General: No focal deficit present.     Mental Status: He is alert.      UC Treatments / Results  Labs (all labs ordered are listed, but only abnormal results are displayed) Labs Reviewed  SARS CORONAVIRUS 2 (TAT 6-24 HRS)    EKG   Radiology No results found.  Procedures Procedures (including critical care time)  Medications Ordered in UC Medications - No data to display  Initial Impression / Assessment and Plan / UC Course  I have reviewed the triage vital signs and the nursing notes.  Pertinent labs & imaging results that were available during my care of the patient were reviewed by me and considered in my medical decision making (see chart for details).    We will send off your test check your my chart for results You will need to quarantine yourself until you do not have symptoms or a negative test.  Stay hydrated Wash your hands  If you begin to have shortness of breath or chest pain go to the er   Final Clinical  Impressions(s) / UC Diagnoses   Final diagnoses:  Cough  Fever, unspecified     Discharge Instructions     We will send off your test check your my chart for results You will need to quarantine yourself until you do not have symptoms or a negative test.  Stay hydrated Wash your hands  If you begin to have shortness of breath or chest pain go to the er     ED Prescriptions    None     PDMP not reviewed this encounter.   Coralyn Mark, NP 02/13/20 1932

## 2020-02-13 NOTE — Discharge Instructions (Signed)
We will send off your test check your my chart for results You will need to quarantine yourself until you do not have symptoms or a negative test.  Stay hydrated Wash your hands  If you begin to have shortness of breath or chest pain go to the er

## 2020-02-15 LAB — NOVEL CORONAVIRUS, NAA (HOSP ORDER, SEND-OUT TO REF LAB; TAT 18-24 HRS): SARS-CoV-2, NAA: NOT DETECTED

## 2023-12-21 ENCOUNTER — Emergency Department (HOSPITAL_COMMUNITY)
Admission: EM | Admit: 2023-12-21 | Discharge: 2023-12-21 | Disposition: A | Discharge status: Home / Self Care | Payer: Worker's Compensation | Attending: Emergency Medicine | Admitting: Emergency Medicine

## 2023-12-21 ENCOUNTER — Encounter (HOSPITAL_COMMUNITY): Payer: Self-pay

## 2023-12-21 ENCOUNTER — Other Ambulatory Visit: Payer: Self-pay

## 2023-12-21 ENCOUNTER — Emergency Department (HOSPITAL_COMMUNITY): Payer: Worker's Compensation

## 2023-12-21 DIAGNOSIS — S61112A Laceration without foreign body of left thumb with damage to nail, initial encounter: Secondary | ICD-10-CM | POA: Diagnosis not present

## 2023-12-21 DIAGNOSIS — Z23 Encounter for immunization: Secondary | ICD-10-CM | POA: Insufficient documentation

## 2023-12-21 DIAGNOSIS — W268XXA Contact with other sharp object(s), not elsewhere classified, initial encounter: Secondary | ICD-10-CM | POA: Diagnosis not present

## 2023-12-21 DIAGNOSIS — Y990 Civilian activity done for income or pay: Secondary | ICD-10-CM | POA: Insufficient documentation

## 2023-12-21 DIAGNOSIS — S60392A Other superficial injuries of left thumb, initial encounter: Secondary | ICD-10-CM | POA: Diagnosis present

## 2023-12-21 MED ORDER — CEPHALEXIN 500 MG PO CAPS: 1000.0000 mg | ORAL_CAPSULE | Freq: Two times a day (BID) | ORAL | 0 refills | Status: AC

## 2023-12-21 MED ORDER — TETANUS-DIPHTH-ACELL PERTUSSIS 5-2.5-18.5 LF-MCG/0.5 IM SUSY
0.5000 mL | PREFILLED_SYRINGE | Freq: Once | INTRAMUSCULAR | Status: AC
  Administered 2023-12-21: 0.5 mL via INTRAMUSCULAR
  Filled 2023-12-21: qty 0.5

## 2023-12-21 MED ORDER — ACETAMINOPHEN 325 MG PO TABS
975.0000 mg | ORAL_TABLET | Freq: Once | ORAL | Status: AC
  Administered 2023-12-21: 975 mg via ORAL
  Filled 2023-12-21: qty 3

## 2023-12-21 MED ORDER — IBUPROFEN 400 MG PO TABS
600.0000 mg | ORAL_TABLET | Freq: Once | ORAL | Status: AC
  Administered 2023-12-21: 600 mg via ORAL
  Filled 2023-12-21: qty 1

## 2023-12-21 NOTE — ED Notes (Signed)
Pt declined workman's comp.

## 2023-12-21 NOTE — ED Provider Notes (Signed)
[place] EMERGENCY DEPARTMENT AT [facility] Provider Note   CSN: [id number] Arrival date & time: 12/21/23  1444     History  Chief Complaint  Patient presents with   Laceration    Isaac Decker is a 28 y.o. male.  Patient to ED with left thumb injury while using a slicer earlier today. He reports cutting off the tip of his thumb. No other injury.  The history is provided by the patient. No language interpreter was used.  Laceration      Home Medications Prior to Admission medications   Medication Sig Start Date End Date Taking? Authorizing Provider  cephALEXin (KEFLEX) 500 MG capsule Take 2 capsules (1,000 mg total) by mouth 2 (two) times daily. 12/21/23  Yes Bryndan Bilyk, Melvenia Beam, PA-C  ibuprofen (ADVIL,MOTRIN) 800 MG tablet Take 1 tablet (800 mg total) by mouth 3 (three) times daily. 09/07/14   Muthersbaugh, Dahlia Client, PA-C  methocarbamol (ROBAXIN) 500 MG tablet Take 1 tablet (500 mg total) by mouth 2 (two) times daily. 09/07/14   Muthersbaugh, Dahlia Client, PA-C      Allergies    Patient has no known allergies.    Review of Systems   Review of Systems  Physical Exam Updated Vital Signs BP 125/79   Pulse 71   Temp 98.7 F (37.1 C)   Resp 18   Ht 5\' 11"  (1.803 m)   Wt 68 kg   SpO2 99%   BMI 20.91 kg/m  Physical Exam Vitals and nursing note reviewed.  Constitutional:      Appearance: He is well-developed.  Pulmonary:     Effort: Pulmonary effort is normal.  Musculoskeletal:        General: Normal range of motion.     Cervical back: Normal range of motion.  Skin:    General: Skin is warm and dry.     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 No bony exposure. Actively bleeding.  Neurological:     Mental Status: He is alert and oriented to person, place, and time.     ED Results / Procedures / Treatments   Labs (all labs ordered are listed, but only abnormal results are displayed) Labs Reviewed - No data to  display  EKG None  Radiology DG Hand 2 View Left Result Date: 12/21/2023 CLINICAL DATA:  Laceration to the tip of the left thumb. EXAM: LEFT HAND - 2 VIEW COMPARISON:  None Available. FINDINGS: Soft tissue laceration at the tip of the thumb with soft tissue swelling and overlying bandaging in place. No radiopaque foreign body. No acute fracture or dislocation. There is no evidence of arthropathy or other focal bone abnormality. IMPRESSION: 1. No acute osseous abnormality. 2. Soft tissue laceration at the tip of the thumb. No radiopaque foreign body. Electronically Signed   By: Hart Robinsons M.D.   On: 12/21/2023 16:54    Procedures Procedures    Medications Ordered in ED Medications  Tdap (BOOSTRIX) injection 0.5 mL (0.5 mLs Intramuscular Given 12/21/23 1521)  acetaminophen (TYLENOL) tablet 975 mg (975 mg Oral Given 12/21/23 1521)  ibuprofen (ADVIL) tablet 600 mg (600 mg Oral Given 12/21/23 1521)    ED Course/ Medical Decision Making/ A&P Clinical Course as of 12/21/23 2013  Wed Dec 21, 2023  1815 Patient with partial amputation of tip of left thumb. No repair required. Will apply Quick Clot and bandage. Will reassess for control of bleeing prior to discharge.  [SU]  2012 Bleeding through  on the bandage on recheck. This was changed by me, QuickClot reapplied and bandaged. On further reassessment there is no further bleeding. Discussed care with the patient and home management, return precautions.  [SU]    Clinical Course User Index [SU] Elpidio Anis, PA-C                                 Medical Decision Making Risk Prescription drug management.           Final Clinical Impression(s) / ED Diagnoses Final diagnoses:  Laceration of left thumb without foreign body with damage to nail, initial encounter    Rx / DC Orders ED Discharge Orders          Ordered    cephALEXin (KEFLEX) 500 MG capsule  2 times daily        12/21/23 2010              Elpidio Anis, PA-C 12/21/23 2013    Tanda Rockers A, DO 12/22/23 2332

## 2023-12-21 NOTE — Discharge Instructions (Addendum)
 If there is bleeding through this bandage tonight, change the bandage and put fresh QuickClot in place like we did in the ED.   If there is no bleeding through tonight, leave the current bandage in place for the next 3 days, then change to a clean bandage. Use the QuickClot gauze if there is any bleeding at that time.   Take the antibiotic for the next 3 days to prevent infection. If there is any uncontrolled bleeding, increased pain or swelling of the thumb with redness - return to the ED for recheck.

## 2023-12-21 NOTE — ED Provider Triage Note (Signed)
 Emergency Medicine Provider Triage Evaluation Note  Isaac Decker , a 28 y.o. male  was evaluated in triage.  Pt complains of left hand pain.  Patient sustained an injury to his left hand while at work this afternoon.  He accidentally cut his left thumb on a large fabric slicer.  Now with a large laceration over the distal tip of the left thumb.  No other injuries.  He is right-handed.  Uncertain last tetanus status  Review of Systems  Positive: As above Negative: As above  Physical Exam  BP 131/77 (BP Location: Right Arm)   Pulse 67   Temp 99 F (37.2 C)   Resp 20   Ht 5\' 11"  (1.803 m)   Wt 68 kg   SpO2 100%   BMI 20.91 kg/m  Gen:   Awake, no distress Resp:  Normal effort MSK:   Moves extremities without difficulty sensation intact to light touch throughout left hand.  Laceration over distal tip of left thumb.  Minimal bleeding.  Full active range of motion throughout left hand.  No other injuries Other:  Medical Decision Making  Medically screening exam initiated at 3:06 PM.  Appropriate orders placed.  Isaac Decker was informed that the remainder of the evaluation will be completed by another provider, this initial triage assessment does not replace that evaluation, and the importance of remaining in the ED until their evaluation is complete.  Will update tetanus status and obtain x-ray of the left hand to look for osseous injury.  Remainder of care per ED team   Sallyanne Creamer, DO 12/21/23 1511

## 2023-12-21 NOTE — ED Triage Notes (Signed)
 Pt was at work. Fabric slicer accidentally sliced his left thumb. Controlled with pressure dressing in place. No bone exposure per EMS. No LOC.  EMS VS:  140/90 HR 80 100% on RA  CBG 106
